# Patient Record
Sex: Female | Born: 2000 | Race: White | Hispanic: No | Marital: Single | Smoking: Never smoker
Health system: Southern US, Community
[De-identification: ages and names within clinical notes are randomized; demographics above are authoritative.]

## PROBLEM LIST (undated history)

## (undated) DIAGNOSIS — J45909 Unspecified asthma, uncomplicated: Secondary | ICD-10-CM

## (undated) HISTORY — DX: Unspecified asthma, uncomplicated: J45.909

## (undated) HISTORY — PX: HIP SURGERY: SHX245

---
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
## 2022-01-21 IMAGING — RF DG FLUORO GUIDE NDL PLC/BX
1 series · 1 of 1 positions shown · non-contrast
Comparison: None.

CLINICAL DATA: Right hip pain

EXAM:
RIGHT HIP ARTHROGRAM UNDER FLUOROSCOPY

[Series 2: cp_standard · 0.26mm/px · 1 of 1 slices shown]
[im 1/1]
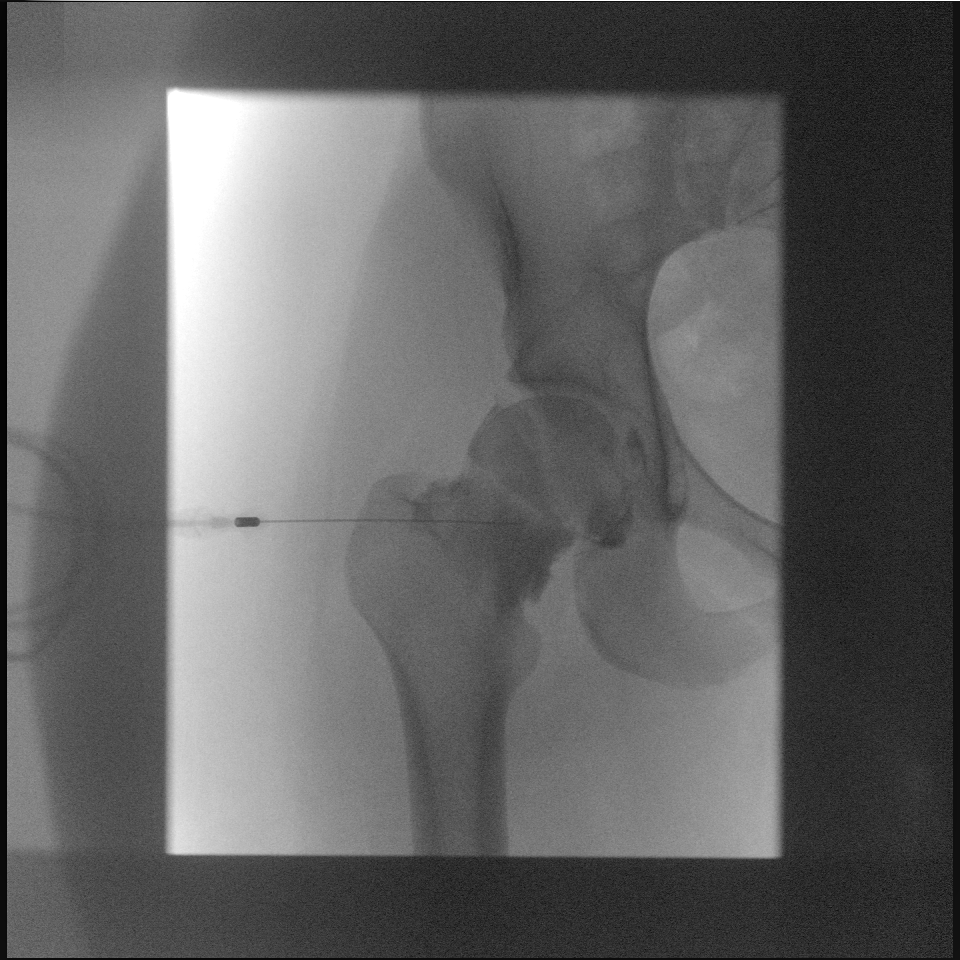

[1 of 1 positions shown; findings below may reference images not displayed]

FLUOROSCOPY TIME:  Fluoroscopy Time:  12 seconds

Radiation Exposure Index (if provided by the fluoroscopic device):
1.1 mGy

Number of Acquired Spot Images: 0

PROCEDURE:
The risks and benefits of the procedure were discussed with the
patient, and written informed consent was obtained. The patient
stated no history of allergy to contrast media. A formal timeout
procedure was performed with the patient according to departmental
protocol.

The patient was placed supine on the fluoroscopy table and the right
hip joint was identified under fluoroscopy. The skin overlying the
right hip joint was subsequently cleaned with Chloraprep and a
sterile drape was placed over the area of interest. 5 ml 1%
Lidocaine was used to anesthetize the skin around the needle
insertion site.

A 22 gauge spinal needle was inserted into the right hip joint under
fluoroscopy. Position was confirmed with injection of less than 1ml
of Omnipaque 180 under fluoroscopy.

10 ml of gadolinium mixture (0.05 mL of Gadavist mixed with 10 mL
sterile saline and 10 mL Omnipaque 180) was injected into the right
hip joint.

The needle was removed and hemostasis was achieved. The patient was
subsequently transferred to MRI for imaging.
IMPRESSION: Successful right hip arthrogram prior to MRI.

## 2022-11-27 ENCOUNTER — Emergency Department
Admission: EM | Admit: 2022-11-27 | Discharge: 2022-11-27 | Disposition: A | Discharge status: Home / Self Care | Payer: 59 | Attending: Emergency Medicine | Admitting: Emergency Medicine

## 2022-11-27 ENCOUNTER — Other Ambulatory Visit: Payer: Self-pay

## 2022-11-27 DIAGNOSIS — H6990 Unspecified Eustachian tube disorder, unspecified ear: Secondary | ICD-10-CM | POA: Insufficient documentation

## 2022-11-27 DIAGNOSIS — Z20822 Contact with and (suspected) exposure to covid-19: Secondary | ICD-10-CM | POA: Diagnosis not present

## 2022-11-27 DIAGNOSIS — M542 Cervicalgia: Secondary | ICD-10-CM | POA: Diagnosis not present

## 2022-11-27 DIAGNOSIS — H6983 Other specified disorders of Eustachian tube, bilateral: Secondary | ICD-10-CM | POA: Diagnosis not present

## 2022-11-27 LAB — RESP PANEL BY RT-PCR (RSV, FLU A&B, COVID)  RVPGX2
Influenza A by PCR: NEGATIVE
Influenza B by PCR: NEGATIVE
Resp Syncytial Virus by PCR: NEGATIVE
SARS Coronavirus 2 by RT PCR: NEGATIVE

## 2022-11-27 LAB — GROUP A STREP BY PCR: Group A Strep by PCR: NOT DETECTED

## 2022-11-27 MED ORDER — FLUTICASONE PROPIONATE 50 MCG/ACT NA SUSP: 2.0000 | Freq: Every day | NASAL | 0 refills | Status: DC

## 2022-11-27 MED ORDER — PREDNISONE 10 MG PO TABS: ORAL_TABLET | ORAL | 0 refills | Status: DC

## 2022-11-27 NOTE — Discharge Instructions (Signed)
Follow-up with [facility] if any continued problems or not improving.  A prescription was sent to the pharmacy for you to begin taking if your strep test is not positive.  I will call you at home as soon as I see the results of your strep test.

## 2022-11-27 NOTE — ED Provider Notes (Signed)
[facility] Provider Note    Event Date/Time   First MD Initiated Contact with Patient 11/27/22 [phone]     (approximate)   History   Neck Pain   HPI  Sierra Miller is a 21 y.o. female   presents to the ED with complaint of sore throat and earaches especially on the right side for the past week.  Patient has been taking over-the-counter medication without any relief.  She is unaware of any fever and denies chills.  Patient has had headaches.  No known exposure to anyone sick.  She also has a small lump under her chin that she is concerned about.      Physical Exam   Triage Vital Signs: ED Triage Vitals [11/27/22 0903]  Enc Vitals Group     BP 93/78     Pulse Rate 97     Resp 18     Temp 98.9 F (37.2 C)     Temp Source Oral     SpO2 99 %     Weight 160 lb (72.6 kg)     Height 5\' 5"  (1.651 m)     Head Circumference      Peak Flow      Pain Score 5     Pain Loc      Pain Edu?      Excl. in GC?     Most recent vital signs: Vitals:   11/27/22 0903  BP: 93/78  Pulse: 97  Resp: 18  Temp: 98.9 F (37.2 C)  SpO2: 99%     General: Awake, no distress.  Alert, talkative, nontoxic. CV:  Good peripheral perfusion.  Resp:  Normal effort.  Lungs are clear bilaterally. Abd:  No distention.  Other:             EMs are dull bilaterally but no erythema or injection is noted.  Poor light reflex.  EACs are clear.  Posterior pharynx without erythema or exudate.  Neck is supple with minimal cervical lymphadenopathy.  Patient is unable to get her ears to pop and thinks that she has decreased hearing in her ears.   ED Results / Procedures / Treatments   Labs (all labs ordered are listed, but only abnormal results are displayed) Labs Reviewed  RESP PANEL BY RT-PCR (RSV, FLU A&B, COVID)  [id number]  GROUP A STREP BY PCR      PROCEDURES:  Critical Care performed:   Procedures   MEDICATIONS ORDERED IN ED: Medications - No data to  display   IMPRESSION / MDM / ASSESSMENT AND PLAN / ED COURSE  I reviewed the triage vital signs and the nursing notes.   Differential diagnosis includes, but is not limited to, strep pharyngitis, influenza, COVID, RSV, viral URI, eustachian tube dysfunction, otitis media.  21 year old female presents to the ED with complaint of ear pain along with sore throat for several days without known fever or chills.  No sick contacts.  Physical exam is suggestive of eustachian tube dysfunction.  Respiratory panel was negative for COVID, influenza and RSV.  Strep test is negative.  A prescription for prednisone was sent to the pharmacy along with Flonase nasal spray.  I discussed with patient and mother the need to follow-up with [facility] if any continued problems.  Patient was actually discharged prior to results of the strep test and mother is aware that she should not pick up the prescription until she has heard from me. ----------------------------------------- 3:18 PM on 11/27/2022 -----------------------------------------  Poke with patient mother who is aware that strep test was negative.  Reinforced prednisone and Flonase nasal spray.  Follow-up with [facility] if any continued problems.      Patient's presentation is most consistent with acute complicated illness / injury requiring diagnostic workup.  FINAL CLINICAL IMPRESSION(S) / ED DIAGNOSES   Final diagnoses:  Dysfunction of Eustachian tube, unspecified laterality     Rx / DC Orders   ED Discharge Orders          Ordered    predniSONE (DELTASONE) 10 MG tablet        11/27/22 1254    fluticasone (FLONASE) 50 MCG/ACT nasal spray  Daily        11/27/22 1254             Note:  This document was prepared using Dragon voice recognition software and may include unintentional dictation errors.   Johnn Hai, PA-C 11/27/22 1518    Naaman Plummer, MD 11/28/22 [phone]

## 2022-11-27 NOTE — ED Notes (Signed)
39 yof with a c/c of neck and ear pain since Monday. The pt also states she found a lump under her chin last night. The pt denies any fevers.

## 2022-11-27 NOTE — ED Triage Notes (Signed)
Pt states that she has had neck pain since Monday- pt states she also feels like she has a lump under her chin- pt has been having sore throat and ear aches on the R side- pt states she is also having head aches

## 2022-12-07 ENCOUNTER — Ambulatory Visit
Admission: EM | Admit: 2022-12-07 | Discharge: 2022-12-07 | Disposition: A | Discharge status: Home / Self Care | Payer: 59 | Attending: Emergency Medicine | Admitting: Emergency Medicine

## 2022-12-07 DIAGNOSIS — B349 Viral infection, unspecified: Secondary | ICD-10-CM | POA: Insufficient documentation

## 2022-12-07 DIAGNOSIS — J029 Acute pharyngitis, unspecified: Secondary | ICD-10-CM | POA: Insufficient documentation

## 2022-12-07 LAB — POCT RAPID STREP A (OFFICE): Rapid Strep A Screen: NEGATIVE

## 2022-12-07 NOTE — ED Provider Notes (Signed)
Renaldo Fiddler    CSN: [id number] Arrival date & time: 12/07/22  1138      History   Chief Complaint Chief Complaint  Patient presents with   Sore Throat    Sore throat and white patches on the back of throat. X4-5 days    HPI Sierra Miller is a 21 y.o. female.  Accompanied by her mother, patient presents with 4-5 day history of ear pain, sore throat, nasal congestion, mild cough.  No fever, rash, shortness of breath, vomiting, diarrhea, or other symptoms.  No OTC medications taken today.  Patient was seen at [facility] ED on 11/27/2022; diagnosed with eustachian tube dysfunction; treated with fluticasone nasal spray and prednisone taper; negative for COVID, flu, RSV, strep.  Her symptoms resolved with treatment.  The history is provided by the patient, a parent and medical records.    History reviewed. No pertinent past medical history.  There are no problems to display for this patient.   Past Surgical History:  Procedure Laterality Date   HIP SURGERY Bilateral    within the last 2 years    OB History   No obstetric history on file.      Home Medications    Prior to Admission medications   Medication Sig Start Date End Date Taking? Authorizing Provider  fluticasone (FLONASE) 50 MCG/ACT nasal spray Place 2 sprays into both nostrils daily. 11/27/22 11/27/23  Tommi Rumps, PA-C  predniSONE (DELTASONE) 10 MG tablet Take 6 tablets  today, on day 2 take 5 tablets, day 3 take 4 tablets, day 4 take 3 tablets, day 5 take  2 tablets and 1 tablet the last day 11/27/22   Tommi Rumps, PA-C    Family History History reviewed. No pertinent family history.  Social History Social History   Tobacco Use   Smoking status: Never   Smokeless tobacco: Never  Substance Use Topics   Alcohol use: No   Drug use: No     Allergies   Patient has no known allergies.   Review of Systems Review of Systems  Constitutional:  Negative for chills and fever.   HENT:  Positive for congestion, ear pain and sore throat.   Respiratory:  Positive for cough. Negative for shortness of breath.   Cardiovascular:  Negative for chest pain and palpitations.  Gastrointestinal:  Negative for diarrhea and vomiting.  Skin:  Negative for color change and rash.  All other systems reviewed and are negative.    Physical Exam Triage Vital Signs ED Triage Vitals  Enc Vitals Group     BP 12/07/22 1238 117/78     Pulse Rate 12/07/22 1238 98     Resp 12/07/22 1238 20     Temp 12/07/22 1238 99.6 F (37.6 C)     Temp Source 12/07/22 1238 Oral     SpO2 12/07/22 1238 96 %     Weight 12/07/22 1236 160 lb (72.6 kg)     Height 12/07/22 1236 5\' 5"  (1.651 m)     Head Circumference --      Peak Flow --      Pain Score 12/07/22 1236 8     Pain Loc --      Pain Edu? --      Excl. in GC? --    No data found.  Updated Vital Signs BP 117/78 (BP Location: Left Arm)   Pulse 98   Temp 99.6 F (37.6 C) (Oral)   Resp 20   Ht 5'  5" (1.651 m)   Wt 160 lb (72.6 kg)   LMP 11/11/2022   SpO2 96%   BMI 26.63 kg/m   Visual Acuity Right Eye Distance:   Left Eye Distance:   Bilateral Distance:    Right Eye Near:   Left Eye Near:    Bilateral Near:     Physical Exam Vitals and nursing note reviewed.  Constitutional:      General: She is not in acute distress.    Appearance: Normal appearance. She is well-developed. She is not ill-appearing.  HENT:     Right Ear: Tympanic membrane normal.     Left Ear: Tympanic membrane normal.     Nose: Nose normal.     Mouth/Throat:     Mouth: Mucous membranes are moist.     Pharynx: Posterior oropharyngeal erythema present.  Cardiovascular:     Rate and Rhythm: Normal rate and regular rhythm.     Heart sounds: Normal heart sounds.  Pulmonary:     Effort: Pulmonary effort is normal. No respiratory distress.     Breath sounds: Normal breath sounds.  Musculoskeletal:     Cervical back: Neck supple.  Skin:    General:  Skin is warm and dry.  Neurological:     Mental Status: She is alert.  Psychiatric:        Mood and Affect: Mood normal.        Behavior: Behavior normal.      UC Treatments / Results  Labs (all labs ordered are listed, but only abnormal results are displayed) Labs Reviewed  POCT RAPID STREP A (OFFICE) - Normal  CULTURE, GROUP A STREP [facility])    EKG   Radiology No results found.  Procedures Procedures (including critical care time)  Medications Ordered in UC Medications - No data to display  Initial Impression / Assessment and Plan / UC Course  I have reviewed the triage vital signs and the nursing notes.  Pertinent labs & imaging results that were available during my care of the patient were reviewed by me and considered in my medical decision making (see chart for details).   Viral illness, sore throat.  Rapid strep negative; culture pending.   Discussed symptomatic treatment including Tylenol or ibuprofen, rest, hydration.  Instructed patient to follow up with PCP if symptoms are not improving.  She agrees to plan of care.    Final Clinical Impressions(s) / UC Diagnoses   Final diagnoses:  Viral illness  Sore throat     Discharge Instructions      Your strep test is negative.  Follow up with your primary care provider if your symptoms are not improving.        ED Prescriptions   None    PDMP not reviewed this encounter.   Mickie Bail, NP 12/07/22 [phone]

## 2022-12-07 NOTE — ED Triage Notes (Signed)
Pt states that she has some nasal congestion, sore throat, ear pain and white patches on the back of her throat. X4-5 days

## 2022-12-07 NOTE — Discharge Instructions (Addendum)
Your strep test is negative.  Follow up with your primary care provider if your symptoms are not improving.    

## 2022-12-08 LAB — CULTURE, GROUP A STREP (THRC)

## 2022-12-09 LAB — CULTURE, GROUP A STREP (THRC)

## 2022-12-10 DIAGNOSIS — B2790 Infectious mononucleosis, unspecified without complication: Secondary | ICD-10-CM | POA: Diagnosis not present

## 2022-12-10 DIAGNOSIS — R509 Fever, unspecified: Secondary | ICD-10-CM | POA: Diagnosis not present

## 2022-12-21 ENCOUNTER — Other Ambulatory Visit: Payer: Self-pay | Admitting: Pediatrics

## 2022-12-21 DIAGNOSIS — R1084 Generalized abdominal pain: Secondary | ICD-10-CM

## 2022-12-22 ENCOUNTER — Other Ambulatory Visit: Payer: Self-pay | Admitting: Pediatrics

## 2022-12-22 ENCOUNTER — Ambulatory Visit
Admission: RE | Admit: 2022-12-22 | Discharge: 2022-12-22 | Disposition: A | Discharge status: Home / Self Care | Payer: 59 | Source: Ambulatory Visit | Attending: Pediatrics | Admitting: Pediatrics

## 2022-12-22 DIAGNOSIS — R1084 Generalized abdominal pain: Secondary | ICD-10-CM | POA: Diagnosis not present

## 2023-02-08 NOTE — Progress Notes (Unsigned)
Tomasita Morrow, NP-C Phone: [phone]  Sierra Miller is a 22 y.o. female who presents today to establish care and for annual exam.  She reports thick, white, clumpy vaginal discharge with vaginal itching for the last month. It has been intermittent. Denies dysuria. Reports a slight odor. Denies urinary frequency, urgency and abdominal pain.   Diet: Well rounded, increased protein, does not snack much Exercise: Goes to the gym 4 times a week- weights/cardio Pap smear: Start at 23 Family history-  Colon cancer: No  Breast cancer: No  Ovarian cancer: No Menses: 01/18/2023 Sexually active: No Vaccines-   Flu: Declined  Tetanus: 03/18/2014  COVID19: Never HIV screening: Deferred Hep C Screening: Deferred Tobacco use: No Alcohol use: No Illicit Drug use: No Dentist: Yes Ophthalmology: Yes  Active Ambulatory Problems    Diagnosis Date Noted   Preventative health care 02/09/2023   Vaginal discharge 02/09/2023   Resolved Ambulatory Problems    Diagnosis Date Noted   No Resolved Ambulatory Problems   Past Medical History:  Diagnosis Date   Asthma     Family History  Problem Relation Age of Onset   Depression Mother    Arthritis Mother    Hypertension Maternal Grandmother    Hearing loss Maternal Grandmother    Arthritis Maternal Grandmother    High Cholesterol Maternal Grandmother     Social History   Socioeconomic History   Marital status: Single    Spouse name: Not on file   Number of children: Not on file   Years of education: Not on file   Highest education level: Not on file  Occupational History   Not on file  Tobacco Use   Smoking status: Never   Smokeless tobacco: Never  Substance and Sexual Activity   Alcohol use: No   Drug use: No   Sexual activity: Not Currently  Other Topics Concern   Not on file  Social History Narrative   Not on file   Social Determinants of Health   Financial Resource Strain: Not on file  Food Insecurity: Not  on file  Transportation Needs: Not on file  Physical Activity: Not on file  Stress: Not on file  Social Connections: Not on file  Intimate Partner Violence: Not on file   ROS  General: Negative for unexplained weight loss, fever Skin: Negative for new or changing mole, sore that won't heal HEENT: Negative for trouble hearing, trouble seeing, ringing in ears, mouth sores, hoarseness, change in voice, dysphagia. CV:  Negative for chest pain, dyspnea, edema, palpitations Resp: Negative for cough, dyspnea, hemoptysis GI: Negative for nausea, vomiting, diarrhea, constipation, abdominal pain, melena, hematochezia. GU: Negative for dysuria, incontinence, urinary hesitance, hematuria, polyuria, sexual difficulty, lumps in testicle or breasts MSK: Negative for muscle cramps or aches, joint pain or swelling Neuro: Negative for headaches, weakness, numbness, dizziness, passing out/fainting Psych: Negative for depression, anxiety, memory problems  Objective  Physical Exam Vitals:   02/09/23 1309  BP: 100/60  Pulse: 92  Temp: 99.1 F (37.3 C)  SpO2: 97%    BP Readings from Last 3 Encounters:  02/09/23 100/60  12/07/22 117/78  11/27/22 93/78   Wt Readings from Last 3 Encounters:  02/09/23 168 lb 6.4 oz (76.4 kg) (91 %, Z= 1.35)*  12/07/22 160 lb (72.6 kg) (88 %, Z= 1.16)*  11/27/22 160 lb (72.6 kg) (88 %, Z= 1.16)*   * Growth percentiles are based on CDC (Girls, 2-20 Years) data.   Physical Exam Constitutional:  General: She is not in acute distress.    Appearance: Normal appearance.  HENT:     Head: Normocephalic.     Right Ear: Tympanic membrane normal.     Left Ear: Tympanic membrane normal.     Nose: Nose normal.     Mouth/Throat:     Mouth: Mucous membranes are moist.     Pharynx: Oropharynx is clear.  Eyes:     Conjunctiva/sclera: Conjunctivae normal.     Pupils: Pupils are equal, round, and reactive to light.  Neck:     Thyroid: No thyromegaly.   Cardiovascular:     Rate and Rhythm: Normal rate and regular rhythm.     Heart sounds: Normal heart sounds.  Pulmonary:     Effort: Pulmonary effort is normal.     Breath sounds: Normal breath sounds.  Abdominal:     General: Abdomen is flat. Bowel sounds are normal.     Palpations: Abdomen is soft. There is no mass.     Tenderness: There is no abdominal tenderness.  Musculoskeletal:        General: Normal range of motion.  Lymphadenopathy:     Cervical: No cervical adenopathy.  Skin:    General: Skin is warm and dry.     Findings: No rash.  Neurological:     General: No focal deficit present.     Mental Status: She is alert.  Psychiatric:        Mood and Affect: Mood normal.        Behavior: Behavior normal.    Assessment/Plan:   Preventative health care Assessment & Plan: Physical exam complete. Lab work as outlined. Will contact patient with results. Pap- deferred to age 65. Declines Flu and COVID vaccines. Tetanus vaccine UTD. Deferred HIV/Hep C screening. Recommended annual follow up with Dentist and Ophthalmology. Encouraged to continue healthy diet and exercise.   Orders: -     CBC with Differential/Platelet -     Comprehensive metabolic panel -     Lipid panel -     TSH  Vaginal discharge Assessment & Plan: Vaginal swab obtained- patient self swabbed. Will send for BV and yeast testing and contact patient with results. Will go ahead and treat with Diflucan 150 mg x 1 dose.   Orders: -     Cervicovaginal ancillary only  Lipid screening -     Lipid panel  Thyroid disorder screen -     TSH   Return in about 1 year (around 02/10/2024), or if symptoms worsen or fail to improve, for Annual Exam.   Tomasita Morrow, NP-C [facility]

## 2023-02-09 ENCOUNTER — Ambulatory Visit: Payer: 59 | Admitting: Nurse Practitioner

## 2023-02-09 ENCOUNTER — Encounter: Payer: Self-pay | Admitting: Nurse Practitioner

## 2023-02-09 ENCOUNTER — Other Ambulatory Visit (HOSPITAL_COMMUNITY)
Admission: RE | Admit: 2023-02-09 | Discharge: 2023-02-09 | Disposition: A | Discharge status: Home / Self Care | Payer: 59 | Source: Ambulatory Visit | Attending: Nurse Practitioner | Admitting: Nurse Practitioner

## 2023-02-09 VITALS — BP 100/60 | HR 92 | Temp 99.1°F | Ht 65.35 in | Wt 168.4 lb

## 2023-02-09 DIAGNOSIS — Z13220 Encounter for screening for lipoid disorders: Secondary | ICD-10-CM | POA: Diagnosis not present

## 2023-02-09 DIAGNOSIS — Z0001 Encounter for general adult medical examination with abnormal findings: Secondary | ICD-10-CM

## 2023-02-09 DIAGNOSIS — N898 Other specified noninflammatory disorders of vagina: Secondary | ICD-10-CM | POA: Insufficient documentation

## 2023-02-09 DIAGNOSIS — Z1329 Encounter for screening for other suspected endocrine disorder: Secondary | ICD-10-CM

## 2023-02-09 DIAGNOSIS — Z0000 Encounter for general adult medical examination without abnormal findings: Secondary | ICD-10-CM | POA: Insufficient documentation

## 2023-02-09 LAB — COMPREHENSIVE METABOLIC PANEL
ALT: 23 U/L (ref 0–35)
AST: 45 U/L — ABNORMAL HIGH (ref 0–37)
Albumin: 4 g/dL (ref 3.5–5.2)
Alkaline Phosphatase: 52 U/L (ref 47–119)
BUN: 17 mg/dL (ref 6–23)
CO2: 26 mEq/L (ref 19–32)
Calcium: 9.4 mg/dL (ref 8.4–10.5)
Chloride: 104 mEq/L (ref 96–112)
Creatinine, Ser: 0.55 mg/dL (ref 0.40–1.20)
GFR: 132.71 mL/min (ref >60.00)
Glucose, Bld: 81 mg/dL (ref 70–99)
Potassium: 3.9 mEq/L (ref 3.5–5.1)
Sodium: 137 mEq/L (ref 135–145)
Total Bilirubin: 0.3 mg/dL (ref 0.2–1.2)
Total Protein: 6.6 g/dL (ref 6.0–8.3)

## 2023-02-09 LAB — LIPID PANEL
Cholesterol: 153 mg/dL (ref 0–200)
HDL: 63.5 mg/dL (ref >39.00)
LDL Cholesterol: 71 mg/dL (ref 0–99)
NonHDL: 89.86
Total CHOL/HDL Ratio: 2
Triglycerides: 93 mg/dL (ref 0.0–149.0)
VLDL: 18.6 mg/dL (ref 0.0–40.0)

## 2023-02-09 LAB — CBC WITH DIFFERENTIAL/PLATELET
Basophils Absolute: 0 10*3/uL (ref 0.0–0.1)
Basophils Relative: 0.6 % (ref 0.0–3.0)
Eosinophils Absolute: 0.1 10*3/uL (ref 0.0–0.7)
Eosinophils Relative: 1.2 % (ref 0.0–5.0)
HCT: 36.5 % (ref 36.0–49.0)
Hemoglobin: 12.1 g/dL (ref 12.0–16.0)
Lymphocytes Relative: 29.2 % (ref 24.0–48.0)
Lymphs Abs: 1.8 10*3/uL (ref 0.7–4.0)
MCHC: 33.2 g/dL (ref 31.0–37.0)
MCV: 82 fl (ref 78.0–98.0)
Monocytes Absolute: 0.5 10*3/uL (ref 0.1–1.0)
Monocytes Relative: 7.2 % (ref 3.0–12.0)
Neutro Abs: 3.9 10*3/uL (ref 1.4–7.7)
Neutrophils Relative %: 61.8 % (ref 43.0–71.0)
Platelets: 282 10*3/uL (ref 150.0–575.0)
RBC: 4.45 Mil/uL (ref 3.80–5.70)
RDW: 15.4 % (ref 11.4–15.5)
WBC: 6.3 10*3/uL (ref 4.5–13.5)

## 2023-02-09 LAB — TSH: TSH: 1.1 u[IU]/mL (ref 0.40–5.00)

## 2023-02-09 MED ORDER — FLUCONAZOLE 150 MG PO TABS: ORAL_TABLET | ORAL | 0 refills | Status: DC

## 2023-02-09 NOTE — Assessment & Plan Note (Signed)
Vaginal swab obtained- patient self swabbed. Will send for BV and yeast testing and contact patient with results. Will go ahead and treat with Diflucan 150 mg x 1 dose.

## 2023-02-09 NOTE — Assessment & Plan Note (Addendum)
Physical exam complete. Lab work as outlined. Will contact patient with results. Pap- deferred to age 22. Declines Flu and COVID vaccines. Tetanus vaccine UTD. Deferred HIV/Hep C screening. Recommended annual follow up with Dentist and Ophthalmology. Encouraged to continue healthy diet and exercise.

## 2023-02-10 LAB — CERVICOVAGINAL ANCILLARY ONLY
Bacterial Vaginitis (gardnerella): NEGATIVE
Candida Glabrata: NEGATIVE
Candida Vaginitis: POSITIVE — AB
Comment: NEGATIVE
Comment: NEGATIVE
Comment: NEGATIVE

## 2023-03-07 ENCOUNTER — Encounter: Payer: Self-pay | Admitting: Nurse Practitioner

## 2023-03-29 ENCOUNTER — Encounter: Payer: Self-pay | Admitting: Nurse Practitioner

## 2023-03-29 ENCOUNTER — Ambulatory Visit (INDEPENDENT_AMBULATORY_CARE_PROVIDER_SITE_OTHER): Payer: 59 | Admitting: Nurse Practitioner

## 2023-03-29 VITALS — BP 118/70 | HR 76 | Temp 99.3°F | Ht 65.36 in | Wt 168.2 lb

## 2023-03-29 DIAGNOSIS — M25471 Effusion, right ankle: Secondary | ICD-10-CM | POA: Diagnosis not present

## 2023-03-29 DIAGNOSIS — M25472 Effusion, left ankle: Secondary | ICD-10-CM | POA: Diagnosis not present

## 2023-03-29 DIAGNOSIS — J069 Acute upper respiratory infection, unspecified: Secondary | ICD-10-CM | POA: Diagnosis not present

## 2023-03-29 MED ORDER — METHYLPREDNISOLONE 4 MG PO TBPK: ORAL_TABLET | ORAL | 0 refills | Status: DC

## 2023-03-29 MED ORDER — AZITHROMYCIN 250 MG PO TABS: ORAL_TABLET | ORAL | 0 refills | Status: AC

## 2023-03-29 NOTE — Assessment & Plan Note (Signed)
No swelling noted today on exam. Patient will continue to monitor. Resolves with elevation. Encouraged to wear compression socks if going to be on feet all day, counseled on adequate fluid intake and decreasing salt intake. Return precautions given to patient.

## 2023-03-29 NOTE — Assessment & Plan Note (Signed)
Symptoms x 2 months. Has been taking anti-histamine daily without relief. Denies sinus tenderness. Will treat with Zpak and MDP. Advised to use Mucinex also. She can continue her nasal spray. Encouraged adequate fluid intake.

## 2023-03-29 NOTE — Progress Notes (Signed)
Bethanie Dicker, NP-C Phone: [phone]  Sierra Miller is a 22 y.o. female who presents today for congestion and runny nose.  Patient reports symptoms for the last 2 months. They are worse at night when laying down.  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 Resolves with elevation. Denies shortness of breath. Denies pain. Denies swelling today.   Social History   Tobacco Use  Smoking Status Never  Smokeless Tobacco Never    Current Outpatient Medications on File Prior to Visit  Medication Sig Dispense Refill   Ascorbic Acid (VITAMIN C CR) 1500 MG TBCR Take 1,500 mg by mouth daily.     ELDERBERRY PO Take 100 mg by mouth daily.     Multiple Vitamins-Minerals (WOMENS MULTI VITAMIN & MINERAL PO) Take 1 tablet by mouth daily.     No current facility-administered medications on file prior to visit.    ROS see history of present illness  Objective  Physical Exam Vitals:   03/29/23 1454  BP: 118/70  Pulse: 76  Temp: 99.3 F (37.4 C)  SpO2: 96%    BP Readings from Last 3 Encounters:  03/29/23 118/70  02/09/23 100/60  12/07/22 117/78   Wt Readings from Last 3 Encounters:  03/29/23 168 lb 3.2 oz (76.3 kg) (91 %, Z= 1.34)*  02/09/23 168 lb 6.4 oz (76.4 kg) (91 %, Z= 1.35)*  12/07/22 160 lb (72.6 kg) (88 %, Z= 1.16)*   * Growth percentiles are based on CDC (Girls, 2-20 Years) data.    Physical Exam Constitutional:      General: She is not in acute distress.    Appearance:  Normal appearance.  HENT:     Head: Normocephalic.     Right Ear: Tympanic membrane normal.     Left Ear: Tympanic membrane normal.     Nose: Rhinorrhea present.     Mouth/Throat:     Mouth: Mucous membranes are moist.     Pharynx: Oropharynx is clear.  Eyes:     Conjunctiva/sclera: Conjunctivae normal.     Pupils: Pupils are equal, round, and reactive to light.  Neck:     Thyroid: No thyromegaly.  Cardiovascular:     Rate and Rhythm: Normal rate and regular rhythm.     Heart sounds: Normal heart sounds.  Pulmonary:     Effort: Pulmonary effort is normal.     Breath sounds: Normal breath sounds.  Abdominal:     General: Abdomen is flat. Bowel sounds are normal.     Palpations: Abdomen is soft. There is no mass.     Tenderness: There is no abdominal tenderness.  Musculoskeletal:        General: Normal range of motion.     Right lower leg: No edema.     Left lower leg: No edema.  Lymphadenopathy:     Cervical: No cervical adenopathy.  Skin:    General: Skin is warm and dry.     Findings: No rash.  Neurological:     General: No focal deficit present.  Mental Status: She is alert.  Psychiatric:        Mood and Affect: Mood normal.        Behavior: Behavior normal.    Assessment/Plan: Please see individual problem list.  Upper respiratory tract infection, unspecified type Assessment & Plan: Symptoms x 2 months. Has been taking anti-histamine daily without relief. Denies sinus tenderness. Will treat with Zpak and MDP. Advised to use Mucinex also. She can continue her nasal spray. Encouraged adequate fluid intake.   Orders: -     Azithromycin; Take 2 tablets on day 1, then 1 tablet daily on days 2 through 5  Dispense: 6 tablet; Refill: 0 -     methylPREDNISolone; Take as directed.  Dispense: 21 each; Refill: 0  Ankle edema, bilateral Assessment & Plan: No swelling noted today on exam. Patient will continue to monitor. Resolves with elevation. Encouraged to wear  compression socks if going to be on feet all day, counseled on adequate fluid intake and decreasing salt intake. Return precautions given to patient.     Return if symptoms worsen or fail to improve.   Bethanie Dicker, NP-C [facility] [employer]

## 2023-04-08 ENCOUNTER — Encounter: Payer: Self-pay | Admitting: Nurse Practitioner

## 2023-04-08 DIAGNOSIS — J329 Chronic sinusitis, unspecified: Secondary | ICD-10-CM

## 2023-08-09 ENCOUNTER — Other Ambulatory Visit: Payer: Self-pay | Admitting: Unknown Physician Specialty

## 2023-09-08 ENCOUNTER — Encounter: Payer: Self-pay | Admitting: Unknown Physician Specialty

## 2023-09-12 NOTE — Discharge Instructions (Signed)
[facility] ENDOSCOPIC SINUS SURGERY [facility]  What is Functional Endoscopic Sinus Surgery?  The Surgery involves making the natural openings of the sinuses larger by removing the bony partitions that separate the sinuses from the nasal cavity.  The natural sinus lining is preserved as much as possible to allow the sinuses to resume normal function after the surgery.  In some patients nasal polyps (excessively swollen lining of the sinuses) may be removed to relieve obstruction of the sinus openings.  The surgery is performed through the nose using lighted scopes, which eliminates the need for incisions on the face.  A septoplasty is a different procedure which is sometimes performed with sinus surgery.  It involves straightening the boy partition that separates the two sides of your nose.  A crooked or deviated septum may need repair if is obstructing the sinuses or nasal airflow.  Turbinate reduction is also often performed during sinus surgery.  The turbinates are bony proturberances from the side walls of the nose which swell and can obstruct the nose in patients with sinus and allergy problems.  Their size can be surgically reduced to help relieve nasal obstruction.  What Can Sinus Surgery Do For Me?  Sinus surgery can reduce the frequency of sinus infections requiring antibiotic treatment.  This can provide improvement in nasal congestion, post-nasal drainage, facial pressure and nasal obstruction.  Surgery will NOT prevent you from ever having an infection again, so it usually only for patients who get infections 4 or more times yearly requiring antibiotics, or for infections that do not clear with antibiotics.  It will not cure nasal allergies, so patients with allergies may still require medication to treat their allergies after surgery. Surgery may improve headaches related to sinusitis, however, some people will continue to  require medication to control sinus headaches related to allergies.  Surgery will do nothing for other forms of headache (migraine, tension or cluster).  What Are the Risks of Endoscopic Sinus Surgery?  Current techniques allow surgery to be performed safely with little risk, however, there are rare complications that patients should be aware of.  Because the sinuses are located around the eyes, there is risk of eye injury, including blindness, though again, this would be quite rare. This is usually a result of bleeding behind the eye during surgery, which can effect vision, though there are treatments to protect the vision and prevent permanent injury. More serious complications would include bleeding inside the brain cavity or damage to the brain.This happens when the fluid around the brain leaks out into the sinus cavity.  Again, all of these complications are uncommon, and spinal fluid leaks can be safely managed surgically if they occur.  The most common complication of sinus surgery is bleeding from the nose, which may require packing or cauterization of the nose.  Patients with polyps may experience recurrence of the polyps that would require revision surgery.  Alterations of sense of smell or injury to the tear ducts are also rare complications.   What is the Surgery Like, and what is the Recovery?  The Surgery usually takes a couple of hours to perform, and is usually performed under a general anesthetic (completely asleep).  Patients are usually discharged home after a couple of hours.  Sometimes during surgery it is necessary to pack the nose to control bleeding, and the packing is left in place for 24 - 48 hours, and removed by your surgeon.  If   a septoplasty was performed during the procedure, there is often a splint placed which must be removed after 5-7 days.   Discomfort: Pain is usually mild to moderate, and can be controlled by prescription pain medication or acetaminophen (Tylenol).   Aspirin, Ibuprofen (Advil, Motrin), or Naprosyn (Aleve) should be avoided, as they can cause increased bleeding.  Most patients feel sinus pressure like they have a bad head cold for several days.  Sleeping with your head elevated can help reduce swelling and facial pressure, as can ice packs over the face.  A humidifier may be helpful to keep the mucous and blood from drying in the nose.   Diet: There are no specific diet restrictions, however, you should generally start with clear liquids and a light diet of bland foods because the anesthetic can cause some nausea.  Advance your diet depending on how your stomach feels.  Taking your pain medication with food will often help reduce stomach upset which pain medications can cause.  Nasal Saline Irrigation: It is important to remove blood clots and dried mucous from the nose as it is healing.  This is done by having you irrigate the nose at least 3 - 4 times daily with a salt water solution.  We recommend using NeilMed Sinus Rinse (available at the drug store).  Fill the squeeze bottle with the solution, bend over a sink, and insert the tip of the squeeze bottle into the nose  of an inch.  Point the tip of the squeeze bottle towards the inside corner of the eye on the same side your irrigating.  Squeeze the bottle and gently irrigate the nose.  If you bend forward as you do this, most of the fluid will flow back out of the nose, instead of down your throat.   The solution should be warm, near body temperature, when you irrigate.   Each time you irrigate, you should use a full squeeze bottle.   Note that if you are instructed to use Nasal Steroid Sprays at any time after your surgery, irrigate with saline BEFORE using the steroid spray, so you do not wash it all out of the nose. Another product, Nasal Saline Gel (such as AYR Nasal Saline Gel) can be applied in each nostril 3 - 4 times daily to moisture the nose and reduce scabbing or crusting.  Bleeding:   Bloody drainage from the nose can be expected for several days, and patients are instructed to irrigate their nose frequently with salt water to help remove mucous and blood clots.  The drainage may be dark red or brown, though some fresh blood may be seen intermittently, especially after irrigation.  Do not blow you nose, as bleeding may occur. If you must sneeze, keep your mouth open to allow air to escape through your mouth.  If heavy bleeding occurs: Irrigate the nose with saline to rinse out clots, then spray the nose 3 - 4 times with Afrin Nasal Decongestant Spray.  The spray will constrict the blood vessels to slow bleeding.  Pinch the lower half of your nose shut to apply pressure, and lay down with your head elevated.  Ice packs over the nose may help as well. If bleeding persists despite these measures, you should notify your doctor.  Do not use the Afrin routinely to control nasal congestion after surgery, as it can result in worsening congestion and may affect healing.     Activity: Return to work varies among patients. Most patients will be out   of work at least 5 - 7 days to recover.  Patient may return to work after they are off of narcotic pain medication, and feeling well enough to perform the functions of their job.  Patients must avoid heavy lifting (over 10 pounds) or strenuous physical for 2 weeks after surgery, so your employer may need to assign you to light duty, or keep you out of work longer if light duty is not possible.  NOTE: you should not drive, operate dangerous machinery, do any mentally demanding tasks or make any important legal or financial decisions while on narcotic pain medication and recovering from the general anesthetic.    Call Your Doctor Immediately if You Have Any of the Following: Bleeding that you cannot control with the above measures Loss of vision, double vision, bulging of the eye or black eyes. Fever over 101 degrees Neck stiffness with severe headache,  fever, nausea and change in mental state. You are always encouraged to call anytime with concerns, however, please call with requests for pain medication refills during office hours.  Office Endoscopy: During follow-up visits your doctor will remove any packing or splints that may have been placed and evaluate and clean your sinuses endoscopically.  Topical anesthetic will be used to make this as comfortable as possible, though you may want to take your pain medication prior to the visit.  How often this will need to be done varies from patient to patient.  After complete recovery from the surgery, you may need follow-up endoscopy from time to time, particularly if there is concern of recurrent infection or nasal polyps.  

## 2023-09-16 ENCOUNTER — Other Ambulatory Visit: Payer: Self-pay

## 2023-09-16 ENCOUNTER — Ambulatory Visit: Payer: 59 | Admitting: Anesthesiology

## 2023-09-16 ENCOUNTER — Ambulatory Visit
Admission: RE | Admit: 2023-09-16 | Discharge: 2023-09-16 | Disposition: A | Discharge status: Home / Self Care | Payer: 59 | Attending: Unknown Physician Specialty | Admitting: Unknown Physician Specialty

## 2023-09-16 ENCOUNTER — Encounter: Payer: Self-pay | Admitting: Unknown Physician Specialty

## 2023-09-16 ENCOUNTER — Encounter
Admission: RE | Disposition: A | Discharge status: Home / Self Care | Payer: Self-pay | Source: Home / Self Care | Attending: Unknown Physician Specialty

## 2023-09-16 DIAGNOSIS — J3489 Other specified disorders of nose and nasal sinuses: Secondary | ICD-10-CM | POA: Insufficient documentation

## 2023-09-16 HISTORY — PX: NASAL SEPTOPLASTY W/ TURBINOPLASTY: SHX2070

## 2023-09-16 LAB — POCT PREGNANCY, URINE: Preg Test, Ur: NEGATIVE

## 2023-09-16 SURGERY — SEPTOPLASTY, NOSE, WITH NASAL TURBINATE REDUCTION
Anesthesia: General | Laterality: Bilateral

## 2023-09-16 MED ORDER — PROPOFOL 10 MG/ML IV BOLUS
INTRAVENOUS | Status: DC | PRN
Start: 2023-09-16 — End: 2023-09-16
  Administered 2023-09-16: 150 mg via INTRAVENOUS
  Administered 2023-09-16: 50 mg via INTRAVENOUS

## 2023-09-16 MED ORDER — DEXMEDETOMIDINE HCL IN NACL 200 MCG/50ML IV SOLN
INTRAVENOUS | Status: DC | PRN
Start: 2023-09-16 — End: 2023-09-16
  Administered 2023-09-16: 12 ug via INTRAVENOUS

## 2023-09-16 MED ORDER — ACETAMINOPHEN 10 MG/ML IV SOLN
INTRAVENOUS | Status: AC
  Filled 2023-09-16: qty 100

## 2023-09-16 MED ORDER — OXYMETAZOLINE HCL 0.05 % NA SOLN
NASAL | Status: AC
  Filled 2023-09-16: qty 30

## 2023-09-16 MED ORDER — PROPOFOL 1000 MG/100ML IV EMUL
INTRAVENOUS | Status: AC
  Filled 2023-09-16: qty 100

## 2023-09-16 MED ORDER — DEXAMETHASONE SODIUM PHOSPHATE 4 MG/ML IJ SOLN
INTRAMUSCULAR | Status: AC
  Filled 2023-09-16: qty 1

## 2023-09-16 MED ORDER — LACTATED RINGERS IV SOLN: INTRAVENOUS | Status: DC

## 2023-09-16 MED ORDER — OXYCODONE HCL 5 MG/5ML PO SOLN
ORAL | Status: AC
  Filled 2023-09-16: qty 10

## 2023-09-16 MED ORDER — LIDOCAINE-EPINEPHRINE 1 %-1:100000 IJ SOLN
INTRAMUSCULAR | Status: DC | PRN
  Administered 2023-09-16: 12 mL

## 2023-09-16 MED ORDER — HYDROCODONE-ACETAMINOPHEN 5-325 MG PO TABS: 1.0000 | ORAL_TABLET | ORAL | 0 refills | Status: AC | PRN

## 2023-09-16 MED ORDER — ACETAMINOPHEN 10 MG/ML IV SOLN
1000.0000 mg | Freq: Once | INTRAVENOUS | Status: AC
  Administered 2023-09-16: 1000 mg via INTRAVENOUS

## 2023-09-16 MED ORDER — OXYCODONE HCL 5 MG/5ML PO SOLN
10.0000 mg | Freq: Once | ORAL | Status: AC
  Administered 2023-09-16: 10 mg via ORAL

## 2023-09-16 MED ORDER — SUCCINYLCHOLINE CHLORIDE 200 MG/10ML IV SOSY
PREFILLED_SYRINGE | INTRAVENOUS | Status: DC | PRN
  Administered 2023-09-16: 100 mg via INTRAVENOUS

## 2023-09-16 MED ORDER — SUCCINYLCHOLINE CHLORIDE 200 MG/10ML IV SOSY
PREFILLED_SYRINGE | INTRAVENOUS | Status: AC
  Filled 2023-09-16: qty 10

## 2023-09-16 MED ORDER — LIDOCAINE HCL (PF) 2 % IJ SOLN
INTRAMUSCULAR | Status: AC
  Filled 2023-09-16: qty 5

## 2023-09-16 MED ORDER — DEXMEDETOMIDINE HCL IN NACL 80 MCG/20ML IV SOLN
INTRAVENOUS | Status: AC
  Filled 2023-09-16: qty 20

## 2023-09-16 MED ORDER — ONDANSETRON HCL 4 MG/2ML IJ SOLN
INTRAMUSCULAR | Status: DC | PRN
Start: 2023-09-16 — End: 2023-09-16
  Administered 2023-09-16: 4 mg via INTRAVENOUS

## 2023-09-16 MED ORDER — SULFAMETHOXAZOLE-TRIMETHOPRIM 800-160 MG PO TABS: 1.0000 | ORAL_TABLET | Freq: Two times a day (BID) | ORAL | 0 refills | Status: AC

## 2023-09-16 MED ORDER — ONDANSETRON HCL 4 MG/2ML IJ SOLN
INTRAMUSCULAR | Status: AC
  Filled 2023-09-16: qty 2

## 2023-09-16 MED ORDER — LIDOCAINE HCL (CARDIAC) PF 100 MG/5ML IV SOSY
PREFILLED_SYRINGE | INTRAVENOUS | Status: DC | PRN
  Administered 2023-09-16: 50 mg via INTRAVENOUS

## 2023-09-16 MED ORDER — PHENYLEPHRINE HCL 0.5 % NA SOLN
NASAL | Status: DC | PRN
  Administered 2023-09-16: 15 mL via TOPICAL

## 2023-09-16 MED ORDER — FENTANYL CITRATE (PF) 100 MCG/2ML IJ SOLN
INTRAMUSCULAR | Status: DC | PRN
  Administered 2023-09-16 (×2): 50 ug via INTRAVENOUS

## 2023-09-16 MED ORDER — DEXAMETHASONE SODIUM PHOSPHATE 4 MG/ML IJ SOLN
INTRAMUSCULAR | Status: DC | PRN
  Administered 2023-09-16: 8 mg via INTRAVENOUS

## 2023-09-16 MED ORDER — OXYMETAZOLINE HCL 0.05 % NA SOLN
6.0000 | Freq: Once | NASAL | Status: AC
  Administered 2023-09-16: 6 via NASAL

## 2023-09-16 MED ORDER — FENTANYL CITRATE (PF) 100 MCG/2ML IJ SOLN
INTRAMUSCULAR | Status: AC
  Filled 2023-09-16: qty 2

## 2023-09-16 SURGICAL SUPPLY — 22 items
DRAPE HEAD BAR (DRAPES) ×1 IMPLANT
DRESSING NASL FOAM PST OP SINU (MISCELLANEOUS) IMPLANT
DRSG NASAL FOAM POST OP SINU (MISCELLANEOUS) ×2
ELECT REM PT RETURN 9FT ADLT (ELECTROSURGICAL) ×1
ELECTRODE REM PT RTRN 9FT ADLT (ELECTROSURGICAL) ×1 IMPLANT
GLOVE SURG ENC TEXT LTX SZ7.5 (GLOVE) ×2 IMPLANT
HANDLE YANKAUER SUCT BULB TIP (MISCELLANEOUS) ×1 IMPLANT
KIT TURNOVER KIT A (KITS) ×1 IMPLANT
NDL HYPO 25GX1X1/2 BEV (NEEDLE) ×1 IMPLANT
NEEDLE HYPO 25GX1X1/2 BEV (NEEDLE) ×1 IMPLANT
PACK ENT CUSTOM (PACKS) ×1 IMPLANT
SPLINT NASAL SEPTAL BLV .50 ST (MISCELLANEOUS) IMPLANT
SPONGE NEURO XRAY DETECT 1X3 (DISPOSABLE) ×1 IMPLANT
STRAP BODY AND KNEE 60X3 (MISCELLANEOUS) ×1 IMPLANT
SUCTION COAG ELEC 10 HAND CTRL (ELECTROSURGICAL) IMPLANT
SUT CHROMIC 3-0 (SUTURE) ×1
SUT CHROMIC 3-0 KS 27XMFL CR (SUTURE) ×1
SUT ETHILON 3-0 KS 30 BLK (SUTURE) ×1 IMPLANT
SUTURE CHRMC 3-0 KS 27XMFL CR (SUTURE) ×1 IMPLANT
SYR 10ML LL (SYRINGE) ×1 IMPLANT
TOWEL OR 17X26 4PK STRL BLUE (TOWEL DISPOSABLE) ×1 IMPLANT
WATER STERILE IRR 250ML POUR (IV SOLUTION) ×1 IMPLANT

## 2023-09-16 NOTE — Anesthesia Preprocedure Evaluation (Addendum)
Anesthesia Evaluation  Patient identified by MRN, date of birth, ID band Patient awake    Reviewed: Allergy & Precautions, H&P , NPO status , Patient's Chart, lab work & pertinent test results  Airway Mallampati: I  TM Distance: >3 FB Neck ROM: Full    Dental no notable dental hx.    Pulmonary neg pulmonary ROS, asthma    Pulmonary exam normal breath sounds clear to auscultation       Cardiovascular negative cardio ROS Normal cardiovascular exam Rhythm:Regular Rate:Normal     Neuro/Psych negative neurological ROS  negative psych ROS   GI/Hepatic negative GI ROS, Neg liver ROS,,,  Endo/Other  negative endocrine ROS    Renal/GU negative Renal ROS  negative genitourinary   Musculoskeletal negative musculoskeletal ROS (+)    Abdominal   Peds negative pediatric ROS (+)  Hematology negative hematology ROS (+)   Anesthesia Other Findings asthma  Reproductive/Obstetrics negative OB ROS                             Anesthesia Physical Anesthesia Plan  ASA: 2  Anesthesia Plan: General ETT   Post-op Pain Management:    Induction: Intravenous  PONV Risk Score and Plan:   Airway Management Planned: Oral ETT  Additional Equipment:   Intra-op Plan:   Post-operative Plan: Extubation in OR  Informed Consent: I have reviewed the patients History and Physical, chart, labs and discussed the procedure including the risks, benefits and alternatives for the proposed anesthesia with the patient or authorized representative who has indicated his/her understanding and acceptance.     Dental Advisory Given  Plan Discussed with: Anesthesiologist, CRNA and Surgeon  Anesthesia Plan Comments: (Patient consented for risks of anesthesia including but not limited to:  - adverse reactions to medications - damage to eyes, teeth, lips or other oral mucosa - nerve damage due to positioning  - sore  throat or hoarseness - Damage to heart, brain, nerves, lungs, other parts of body or loss of life  Patient voiced understanding.)       Anesthesia Quick Evaluation

## 2023-09-16 NOTE — Anesthesia Procedure Notes (Signed)
Procedure Name: Intubation Date/Time: 09/16/2023 12:50 PM  Performed by: Domenic Moras, CRNAPre-anesthesia Checklist: Patient identified, Emergency Drugs available, Suction available and Patient being monitored Patient Re-evaluated:Patient Re-evaluated prior to induction Oxygen Delivery Method: Circle system utilized Preoxygenation: Pre-oxygenation with 100% oxygen Induction Type: IV induction Ventilation: Mask ventilation without difficulty Laryngoscope Size: Mac and 3 Grade View: Grade I Tube type: Oral Tube size: 7.0 mm Number of attempts: 1 Airway Equipment and Method: Stylet and Oral airway Placement Confirmation: ETT inserted through vocal cords under direct vision, positive ETCO2 and breath sounds checked- equal and bilateral Secured at: 20 cm Tube secured with: Tape Dental Injury: Teeth and Oropharynx as per pre-operative assessment

## 2023-09-16 NOTE — H&P (Signed)
The patient's history has been reviewed, patient examined, no change in status, stable for surgery.  Questions were answered to the patients satisfaction.  

## 2023-09-16 NOTE — Transfer of Care (Signed)
Immediate Anesthesia Transfer of Care Note  Patient: Sierra Miller  Procedure(s) Performed: NASAL SEPTOPLASTY WITH SUBMUCOSAL RESECTION (Bilateral)  Patient Location: PACU  Anesthesia Type: General ETT  Level of Consciousness: awake, alert  and patient cooperative  Airway and Oxygen Therapy: Patient Spontanous Breathing and Patient connected to supplemental oxygen  Post-op Assessment: Post-op Vital signs reviewed, Patient's Cardiovascular Status Stable, Respiratory Function Stable, Patent Airway and No signs of Nausea or vomiting  Post-op Vital Signs: Reviewed and stable  Complications: No notable events documented.

## 2023-09-16 NOTE — Op Note (Signed)
PREOPERATIVE DIAGNOSIS:  Chronic nasal obstruction.  POSTOPERATIVE DIAGNOSIS:  Chronic nasal obstruction.  SURGEON:  Davina Poke, M.D.  NAME OF PROCEDURE:  Nasal septoplasty. Submucous resection of inferior turbinates.  OPERATIVE FINDINGS:  Severe nasal septal deformity, hypertrophy of the inferior turbinates.   DESCRIPTION OF THE PROCEDURE:  Sierra Miller was identified in the holding area and taken to the operating room and placed in the supine position.  After general endotracheal anesthesia was induced, the table was turned 45 degrees and the patient was placed in a semi-Fowler position.  The nose was then topically anesthetized with Lidocaine, cotton pledgets were placed within each nostril. After approximately 5 minutes, this was removed at which time a local anesthetic of 1% Lidocaine 1:100,000 units of Epinephrine was used to inject the inferior turbinates in the nasal septum. A total of 12 ml was used. Examination of the nose showed a left nasal septal deformity and hypertrophied inferior turbinate.  Beginning on the right hand side a hemitransfixion incision was then created on the leading edge of the septum on the right.  A subperichondrial plane was elevated posteriorly on the left and taken back to the perpendicular plate of the ethmoid where subperiosteal plane was elevated posteriorly on the left. A  septal spur was identified on the left hand side impacting on the inferior turbinate.  An inferior rim of cartilage was removed anteriorly with care taken to leave an anterior strut to prevent nasal collapse. With this strut removed the perpendicular plate of the ethmoid was separated from the quadrangular cartilage. The large septal spur was removed.  The septum was then replaced in the midline. Reinspection through each nostril showed excellent reduction of the septal deformity. A left posterior inferior fenestration was then created to allow hematoma drainage.  With the  septoplasty completed, beginning on the left-hand side, a 15 blade was used to incise along the inferior edge of the inferior turbinate. A superior laterally based flap was then elevated. The underlying conchal bone of mucosa was excised using Knight scissors. The flap was then laid back over the turbinate stump and cauterized using suction cautery. In a similar fashion the submucous resection was performed on the right.  With the submucous resection completed bilaterally and no active bleeding, the hemitransfixion incision was then closed using two interrupted 3-0 chromic sutures.  Plastic nasal septal splints were placed within each nostril and affixed to the septum using a 3-0 nylon suture. Stammberger was then used beneath each inferior turbinate for hemostasis.    The patient tolerated the procedure well, was returned to anesthesia, extubated in the operating room, and taken to the recovery room in stable condition.    CULTURES:  None.  SPECIMENS:  None.  ESTIMATED BLOOD LOSS:  25 cc.  Davina Poke  09/16/2023  1:35 PM

## 2023-09-20 NOTE — Anesthesia Postprocedure Evaluation (Signed)
Anesthesia Post Note  Patient: Sierra Miller  Procedure(s) Performed: NASAL SEPTOPLASTY WITH SUBMUCOSAL RESECTION (Bilateral)  Patient location during evaluation: PACU Anesthesia Type: General Level of consciousness: awake and alert Pain management: pain level controlled Vital Signs Assessment: post-procedure vital signs reviewed and stable Respiratory status: spontaneous breathing, nonlabored ventilation, respiratory function stable and patient connected to nasal cannula oxygen Cardiovascular status: blood pressure returned to baseline and stable Postop Assessment: no apparent nausea or vomiting Anesthetic complications: no   No notable events documented.   Last Vitals:  Vitals:   09/16/23 1410 09/16/23 1415  BP:  108/64  Pulse: 89 83  Resp: 12 12  Temp:    SpO2: 99% 99%    Last Pain:  Vitals:   09/19/23 1248  TempSrc:   PainSc: 0-No pain                 Zahli Vetsch C Wyatt Galvan

## 2024-11-30 ENCOUNTER — Telehealth: Payer: Self-pay | Admitting: Nurse Practitioner

## 2024-11-30 NOTE — Telephone Encounter (Signed)
 Copied from CRM [id number]. Topic: General - Billing Inquiry >> Nov 30, 2024 11:27 AM Emylou G wrote: Reason for CRM:  Valia Wingard called ( her mom ).. she said there is a billing issue - billing department said to contact us . Leron Glance is not credentialed with [employer] who will be her new insurance in January.  How can we get this fixed for her future appts to where Leron Glance will be recognized when she uses [employer].  Pls call her back - Billing said would need to go through [employer]...  [phone] is moms phone number  I called the patient's mother to inform her that a message has been sent to credentialing and I am waiting on their reply. I will call her back with an answer.

## 2024-12-07 ENCOUNTER — Encounter: Payer: Self-pay | Admitting: Nurse Practitioner

## 2024-12-07 ENCOUNTER — Ambulatory Visit: Payer: Self-pay | Admitting: Nurse Practitioner

## 2024-12-07 ENCOUNTER — Ambulatory Visit: Admitting: Nurse Practitioner

## 2024-12-07 VITALS — BP 120/76 | HR 72 | Temp 98.5°F | Ht 65.0 in | Wt 177.2 lb

## 2024-12-07 DIAGNOSIS — Z124 Encounter for screening for malignant neoplasm of cervix: Secondary | ICD-10-CM

## 2024-12-07 DIAGNOSIS — N920 Excessive and frequent menstruation with regular cycle: Secondary | ICD-10-CM

## 2024-12-07 DIAGNOSIS — Z0001 Encounter for general adult medical examination with abnormal findings: Secondary | ICD-10-CM | POA: Diagnosis not present

## 2024-12-07 DIAGNOSIS — Z23 Encounter for immunization: Secondary | ICD-10-CM

## 2024-12-07 DIAGNOSIS — E611 Iron deficiency: Secondary | ICD-10-CM

## 2024-12-07 LAB — CBC WITH DIFFERENTIAL/PLATELET
Basophils Absolute: 0 K/uL (ref 0.0–0.1)
Basophils Relative: 0.5 % (ref 0.0–3.0)
Eosinophils Absolute: 0 K/uL (ref 0.0–0.7)
Eosinophils Relative: 1.1 % (ref 0.0–5.0)
HCT: 40.3 % (ref 36.0–46.0)
Hemoglobin: 13.4 g/dL (ref 12.0–15.0)
Lymphocytes Relative: 26.8 % (ref 12.0–46.0)
Lymphs Abs: 1.2 K/uL (ref 0.7–4.0)
MCHC: 33.4 g/dL (ref 30.0–36.0)
MCV: 84.9 fl (ref 78.0–100.0)
Monocytes Absolute: 0.5 K/uL (ref 0.1–1.0)
Monocytes Relative: 11.5 % (ref 3.0–12.0)
Neutro Abs: 2.6 K/uL (ref 1.4–7.7)
Neutrophils Relative %: 60.1 % (ref 43.0–77.0)
Platelets: 241 K/uL (ref 150.0–400.0)
RBC: 4.74 Mil/uL (ref 3.87–5.11)
RDW: 13.8 % (ref 11.5–15.5)
WBC: 4.4 K/uL (ref 4.0–10.5)

## 2024-12-07 LAB — IBC + FERRITIN
Ferritin: 9.9 ng/mL — ABNORMAL LOW (ref 10.0–291.0)
Iron: 34 ug/dL — ABNORMAL LOW (ref 42–145)
Saturation Ratios: 8.9 % — ABNORMAL LOW (ref 20.0–50.0)
TIBC: 383.6 ug/dL (ref 250.0–450.0)
Transferrin: 274 mg/dL (ref 212.0–360.0)

## 2024-12-07 NOTE — Assessment & Plan Note (Addendum)
 Physical exam complete. We will check lab work as outlined. Pap smear is due. She declines flu and COVID vaccines. She has no significant family history or lifestyle risk factors. She is due for a tetanus booster, which was administered today. Referral placed to  OB GYN for a Pap smear and further evaluation of menstrual issues. Continue regular dental and eye exams. Encourage healthy diet and regular exercise.

## 2024-12-07 NOTE — Assessment & Plan Note (Signed)
 She experiences severe dysmenorrhea and menorrhagia with syncope and emesis. Concern for iron deficiency anemia. She prefers non-hormonal management. Check lab work as outlined and refer to [facility]YN for further evaluation and management.

## 2024-12-07 NOTE — Progress Notes (Signed)
 " Leron Glance, NP-C Phone: [phone]  Sierra Miller is a 23 y.o. female who presents today for annual exam.   Discussed the use of AI scribe software for clinical note transcription with the patient, who gave verbal consent to proceed.  History of Present Illness   Sierra Miller is a 23 year old female who presents for annual exam with severe menstrual cramps and heavy periods.  She experiences severe menstrual cramps and heavy periods, which have been progressively worsening. The intensity of the cramps has led to episodes of syncope and emesis. Her last menstrual period was approximately two weeks ago, during which she vomited three times due to the pain.  She uses both a tampon and a pad simultaneously due to heavy bleeding, often resulting in bleeding through both. She changes them approximately every hour, especially during the first two days of her period, which she describes as the heaviest. She also experiences blood clots during her periods.  No family history of breast, ovarian, or colon cancer. She does not smoke, consume alcohol, or use drugs. 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 Her diet is generally well-balanced, though she notes it has not been as balanced in the past month.  No chest pain, shortness of breath, abdominal pain, constipation, diarrhea, burning during urination, headaches, dizziness, trouble swallowing, skin changes, rashes, joint pain, or mood disorders. She reports occasional fatigue and feeling cold. She experiences intermittent abdominal pain that sometimes resolves after using the bathroom.      Tobacco Use History[1]  Medications Ordered Prior to Encounter[2]   ROS see history of present illness  Objective  Physical Exam Vitals:   12/07/24 1310  BP: 120/76  Pulse: 72  Temp: 98.5 F (36.9 C)  SpO2: 99%    BP Readings from  Last 3 Encounters:  12/07/24 120/76  09/16/23 108/64  03/29/23 118/70   Wt Readings from Last 3 Encounters:  12/07/24 177 lb 3.2 oz (80.4 kg)  09/16/23 178 lb 6.4 oz (80.9 kg)  03/29/23 168 lb 3.2 oz (76.3 kg) (91%, Z= 1.34)*   * Growth percentiles are based on CDC (Girls, 2-20 Years) data.    Physical Exam Constitutional:      General: She is not in acute distress.    Appearance: Normal appearance.  HENT:     Head: Normocephalic.     Right Ear: Tympanic membrane normal.     Left Ear: Tympanic membrane normal.     Nose: Nose normal.     Mouth/Throat:     Mouth: Mucous membranes are moist.     Pharynx: Oropharynx is clear.  Eyes:     Conjunctiva/sclera: Conjunctivae normal.     Pupils: Pupils are equal, round, and reactive to light.  Neck:     Thyroid: No thyromegaly.  Cardiovascular:     Rate and Rhythm: Normal rate and regular rhythm.     Heart sounds: Normal heart sounds.  Pulmonary:     Effort: Pulmonary effort is normal.     Breath sounds: Normal breath sounds.  Abdominal:     General: Abdomen is flat. Bowel sounds are normal.     Palpations: Abdomen is soft. There is no mass.     Tenderness: There is no abdominal tenderness.  Musculoskeletal:        General: Normal range of motion.  Lymphadenopathy:     Cervical: No cervical adenopathy.  Skin:  General: Skin is warm and dry.     Findings: No rash.  Neurological:     General: No focal deficit present.     Mental Status: She is alert.  Psychiatric:        Mood and Affect: Mood normal.        Behavior: Behavior normal.      Assessment/Plan: Please see individual problem list.  Encounter for routine adult health examination with abnormal findings Assessment & Plan: Physical exam complete. We will check lab work as outlined. Pap smear is due. She declines flu and COVID vaccines. She has no significant family history or lifestyle risk factors. She is due for a tetanus booster, which was administered  today. Referral placed to  OB GYN for a Pap smear and further evaluation of menstrual issues. Continue regular dental and eye exams. Encourage healthy diet and regular exercise.    Menorrhagia with regular cycle Assessment & Plan: She experiences severe dysmenorrhea and menorrhagia with syncope and emesis. Concern for iron deficiency anemia. She prefers non-hormonal management. Check lab work as outlined and refer to [facility] for further evaluation and management.  Orders: -     CBC with Differential/Platelet -     IBC + Ferritin -     Ambulatory referral to Obstetrics / Gynecology  Screening for cervical cancer -     Ambulatory referral to Obstetrics / Gynecology  Need for Tdap vaccination -     Tdap vaccine greater than or equal to 7yo IM     Return in about 1 year (around 12/07/2025) for Annual Exam, sooner as needed.   Leron Glance, NP-C [facility] [place]     [1]  Social History Tobacco Use  Smoking Status Never  Smokeless Tobacco Never  [2]  Current Outpatient Medications on File Prior to Visit  Medication Sig Dispense Refill   Ascorbic Acid (VITAMIN C CR) 1500 MG TBCR Take 1,500 mg by mouth daily.     MAGNESIUM PO Take by mouth daily.     Multiple Vitamins-Minerals (WOMENS MULTI VITAMIN & MINERAL PO) Take 1 tablet by mouth daily.     sulfamethoxazole -trimethoprim  (BACTRIM  DS) 800-160 MG tablet Take 1 tablet by mouth 2 (two) times daily. 20 tablet 0   No current facility-administered medications on file prior to visit.   "

## 2024-12-31 ENCOUNTER — Other Ambulatory Visit

## 2024-12-31 DIAGNOSIS — E611 Iron deficiency: Secondary | ICD-10-CM | POA: Diagnosis not present

## 2024-12-31 LAB — CBC WITH DIFFERENTIAL/PLATELET
Basophils Absolute: 0 K/uL (ref 0.0–0.1)
Basophils Relative: 0.6 % (ref 0.0–3.0)
Eosinophils Absolute: 0.1 K/uL (ref 0.0–0.7)
Eosinophils Relative: 1.6 % (ref 0.0–5.0)
HCT: 39.4 % (ref 36.0–46.0)
Hemoglobin: 13.3 g/dL (ref 12.0–15.0)
Lymphocytes Relative: 33.9 % (ref 12.0–46.0)
Lymphs Abs: 1.3 K/uL (ref 0.7–4.0)
MCHC: 33.8 g/dL (ref 30.0–36.0)
MCV: 84.8 fl (ref 78.0–100.0)
Monocytes Absolute: 0.3 K/uL (ref 0.1–1.0)
Monocytes Relative: 8.4 % (ref 3.0–12.0)
Neutro Abs: 2.1 K/uL (ref 1.4–7.7)
Neutrophils Relative %: 55.5 % (ref 43.0–77.0)
Platelets: 279 K/uL (ref 150.0–400.0)
RBC: 4.64 Mil/uL (ref 3.87–5.11)
RDW: 14 % (ref 11.5–15.5)
WBC: 3.8 K/uL — ABNORMAL LOW (ref 4.0–10.5)

## 2024-12-31 LAB — IBC + FERRITIN
Ferritin: 7.7 ng/mL — ABNORMAL LOW (ref 10.0–291.0)
Iron: 105 ug/dL (ref 42–145)
Saturation Ratios: 28.8 % (ref 20.0–50.0)
TIBC: 364 ug/dL (ref 250.0–450.0)
Transferrin: 260 mg/dL (ref 212.0–360.0)

## 2025-01-02 ENCOUNTER — Ambulatory Visit: Payer: Self-pay | Admitting: Nurse Practitioner

## 2025-01-08 ENCOUNTER — Other Ambulatory Visit: Payer: Self-pay | Admitting: Nurse Practitioner

## 2025-01-08 DIAGNOSIS — E611 Iron deficiency: Secondary | ICD-10-CM

## 2025-01-13 ENCOUNTER — Telehealth: Payer: Self-pay | Admitting: Oncology

## 2025-01-13 NOTE — Telephone Encounter (Signed)
 Called pt and informed them that we will be closed tomorrow due to snow. We got her rescheduled to Monday 2/9 and 230 she agreed to the changed and confirmed she will be there

## 2025-01-14 ENCOUNTER — Inpatient Hospital Stay: Admitting: Oncology

## 2025-01-14 ENCOUNTER — Inpatient Hospital Stay

## 2025-01-21 ENCOUNTER — Inpatient Hospital Stay: Admitting: Oncology

## 2025-01-21 ENCOUNTER — Inpatient Hospital Stay

## 2025-02-06 ENCOUNTER — Encounter: Admitting: Registered Nurse

## 2025-12-10 ENCOUNTER — Encounter: Admitting: Nurse Practitioner
